# Patient Record
Sex: Male | Born: 1975 | Race: White | Hispanic: No | Marital: Single
Health system: Southern US, Community
[De-identification: ages and names within clinical notes are randomized; demographics above are authoritative.]

---

## 2019-10-12 ENCOUNTER — Other Ambulatory Visit: Payer: Self-pay

## 2019-10-12 ENCOUNTER — Encounter: Payer: Self-pay | Admitting: Emergency Medicine

## 2019-10-12 ENCOUNTER — Emergency Department
Admission: EM | Admit: 2019-10-12 | Discharge: 2019-10-12 | Disposition: A | Discharge status: Home / Self Care | Payer: No Typology Code available for payment source | Attending: Student | Admitting: Student

## 2019-10-12 ENCOUNTER — Emergency Department: Payer: No Typology Code available for payment source

## 2019-10-12 DIAGNOSIS — F17210 Nicotine dependence, cigarettes, uncomplicated: Secondary | ICD-10-CM | POA: Diagnosis not present

## 2019-10-12 DIAGNOSIS — R0602 Shortness of breath: Secondary | ICD-10-CM | POA: Diagnosis present

## 2019-10-12 DIAGNOSIS — R0981 Nasal congestion: Secondary | ICD-10-CM | POA: Diagnosis not present

## 2019-10-12 LAB — COMPREHENSIVE METABOLIC PANEL
ALT: 41 U/L (ref 0–44)
AST: 35 U/L (ref 15–41)
Albumin: 4.2 g/dL (ref 3.5–5.0)
Alkaline Phosphatase: 68 U/L (ref 38–126)
Anion gap: 9 (ref 5–15)
BUN: 16 mg/dL (ref 6–20)
CO2: 25 mmol/L (ref 22–32)
Calcium: 9.2 mg/dL (ref 8.9–10.3)
Chloride: 107 mmol/L (ref 98–111)
Creatinine, Ser: 0.99 mg/dL (ref 0.61–1.24)
GFR calc Af Amer: >60 mL/min (ref >60)
GFR calc non Af Amer: >60 mL/min (ref >60)
Glucose, Bld: 118 mg/dL — ABNORMAL HIGH (ref 70–99)
Potassium: 4.4 mmol/L (ref 3.5–5.1)
Sodium: 141 mmol/L (ref 135–145)
Total Bilirubin: 0.6 mg/dL (ref 0.3–1.2)
Total Protein: 7.6 g/dL (ref 6.5–8.1)

## 2019-10-12 LAB — CBC
HCT: 48.6 % (ref 39.0–52.0)
Hemoglobin: 16.8 g/dL (ref 13.0–17.0)
MCH: 33.5 pg (ref 26.0–34.0)
MCHC: 34.6 g/dL (ref 30.0–36.0)
MCV: 97 fL (ref 80.0–100.0)
Platelets: 210 10*3/uL (ref 150–400)
RBC: 5.01 MIL/uL (ref 4.22–5.81)
RDW: 12.3 % (ref 11.5–15.5)
WBC: 7.3 10*3/uL (ref 4.0–10.5)
nRBC: 0 % (ref 0.0–0.2)

## 2019-10-12 MED ORDER — FEXOFENADINE-PSEUDOEPHED ER 60-120 MG PO TB12: 1.0000 | ORAL_TABLET | Freq: Two times a day (BID) | ORAL | 0 refills | Status: AC

## 2019-10-12 NOTE — ED Triage Notes (Signed)
Pt reports woke up this am and he felt like he could not breathe. Pt states for the last few days he has had a runny nose and he always has a cough because he smokes but he has not felt winded like he does now.

## 2019-10-12 NOTE — Discharge Instructions (Signed)
Follow discharge care instructions take medication as directed. °

## 2019-10-12 NOTE — ED Notes (Signed)
Pt concerned about work crew.  Pt seemed like he would leave it be knew he could get his results from MyChart.  Pt was informed that he should talk to a doctor before making his decision.  LW edt

## 2019-10-12 NOTE — ED Notes (Signed)
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

## 2019-10-12 NOTE — ED Provider Notes (Signed)
[facility] Emergency Department Provider Note   ____________________________________________   First MD Initiated Contact with Patient 10/12/19 0827     (approximate)  I have reviewed the triage vital signs and the nursing notes.   HISTORY  Chief Complaint Shortness of Breath and Nasal Congestion    HPI Jonathan Kline is a 44 y.o. male patient complain of shortness of breath with a.m. awakening.  Patient states he constantly has a runny nose and cough.  Patient also complains of facial pain.  Patient smokes 2 packs of cigarettes a day.  Patient  notice dyspnea increases supine position.  Upon entering the room the patient is laying on his back in no acute distress.  Patient denies chest pain.  Patient denies recent travel or known contact with COVID-19.  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         History reviewed. No pertinent past medical history.  There are no problems to display for this patient.   History reviewed. No pertinent surgical history.  Prior to Admission medications   Medication Sig Start Date End Date Taking? Authorizing Provider  fexofenadine-pseudoephedrine (ALLEGRA-D) 60-120 MG 12 hr tablet Take 1 tablet by mouth 2 (two) times daily. 10/12/19   Sable Feil, PA-C    Allergies Penicillins  No family history on file.  Social History Social History   Tobacco Use  . Smoking status: Not on file  Substance Use Topics  . Alcohol use: Not on file  . Drug use: Not on file    Review of Systems Constitutional: No fever/chills Eyes: No visual changes. ENT: No sore throat.  Nasal congestion Cardiovascular: Denies chest pain. Respiratory:  shortness of breath. Gastrointestinal: No abdominal pain.  No nausea, no vomiting.  No diarrhea.  No constipation. Genitourinary: Negative for dysuria. Musculoskeletal: Negative for back pain. Skin: Negative for rash. Neurological: Negative for  headaches, focal weakness or numbness. Allergic/Immunilogical: Penicillin  ____________________________________________   PHYSICAL EXAM:  VITAL SIGNS: ED Triage Vitals  Enc Vitals Group     BP 10/12/19 0736 130/77     Pulse Rate 10/12/19 0736 94     Resp 10/12/19 0736 16     Temp 10/12/19 0736 97.7 F (36.5 C)     Temp Source 10/12/19 0736 Oral     SpO2 10/12/19 0736 91 %     Weight 10/12/19 0727 180 lb (81.6 kg)     Height 10/12/19 0727 6' (1.829 m)     Head Circumference --      Peak Flow --      Pain Score 10/12/19 0727 0     Pain Loc --      Pain Edu? --      Excl. in [place]? --     Constitutional: Alert and oriented. Well appearing and in no acute distress. Eyes: Conjunctivae are normal. PERRL. EOMI. Head: Atraumatic. Nose: Edematous nasal turbinates clear rhinorrhea.  Bilateral maxillary guarding. Mouth/Throat: Mucous membranes are moist.  Oropharynx non-erythematous. Neck: No stridor.   Hematological/Lymphatic/Immunilogical: No cervical lymphadenopathy. Cardiovascular: Normal rate, regular rhythm. Grossly normal heart sounds.  Good peripheral circulation. Respiratory: Normal respiratory effort.  No retractions. Lungs CTAB. Skin:  Skin is warm, dry and intact. No rash noted. Psychiatric: Mood and affect are normal. Speech and behavior are normal.  ____________________________________________   LABS (all labs ordered are listed, but only abnormal results are displayed)  Labs Reviewed  COMPREHENSIVE METABOLIC PANEL - Abnormal; Notable for the following components:  Result Value   Glucose, Bld 118 (*)    All other components within normal limits  CBC   ____________________________________________  EKG  EKG read by heart station Dr. With no acute findings.  ____________________________________________  RADIOLOGY  ED MD interpretation:   Official radiology report(s): DG Chest 2 View  Result Date: 10/12/2019 CLINICAL DATA:  Shortness of breath EXAM:  CHEST - 2 VIEW COMPARISON:  None. FINDINGS: The heart size and mediastinal contours are within normal limits. Mild elevation of the right hemidiaphragm. There is an apparent osseous defect of the posterolateral right seventh rib. IMPRESSION: 1. Mild elevation of the right hemidiaphragm. No acute abnormality of the lungs. 2. Apparent osseous defect of the posterolateral right seventh rib, of uncertain nature, possibly postoperative versus lytic osseous lesion. Correlate with history of prior thoracotomy, if applicable. Consider CT to further evaluate. Electronically Signed   By: Lauralyn Primes M.D.   On: 10/12/2019 08:17    ____________________________________________   PROCEDURES  Procedure(s) performed (including Critical Care):  Procedures   ____________________________________________   INITIAL IMPRESSION / ASSESSMENT AND PLAN / ED COURSE  As part of my medical decision making, I reviewed the following data within the electronic MEDICAL RECORD NUMBER     Patient presents with pain awakening dysuria and nasal congestion.  Discussed chest x-ray and lab results with patient.  Physical exam is consistent with nasal congestion.  Patient given discharge care instructions and advised take medication as directed.  Advised establish care with [facility].    Jonathan Kline was evaluated in Emergency Department on 10/12/2019 for the symptoms described in the history of present illness. He was evaluated in the context of the global COVID-19 pandemic, which necessitated consideration that the patient might be at risk for infection with the SARS-CoV-2 virus that causes COVID-19. Institutional protocols and algorithms that pertain to the evaluation of patients at risk for COVID-19 are in a state of rapid change based on information released by regulatory bodies including the CDC and federal and state organizations. These policies and algorithms were followed during the patient's care in the ED.        ____________________________________________   FINAL CLINICAL IMPRESSION(S) / ED DIAGNOSES  Final diagnoses:  Nasal congestion     ED Discharge Orders         Ordered    fexofenadine-pseudoephedrine (ALLEGRA-D) 60-120 MG 12 hr tablet  2 times daily     10/12/19 0843           Note:  This document was prepared using Dragon voice recognition software and may include unintentional dictation errors.    Joni Reining, PA-C 10/12/19 4235    Miguel Aschoff., MD 10/12/19 [phone]

## 2021-09-26 IMAGING — CR DG CHEST 2V
1 series · 2 of 2 positions shown · non-contrast
Comparison: None.

CLINICAL DATA: Shortness of breath

EXAM:
CHEST - 2 VIEW

[Series 1: dg chest 2 view · 0.14mm/px · 2 of 2 slices shown]
[im 1/2]
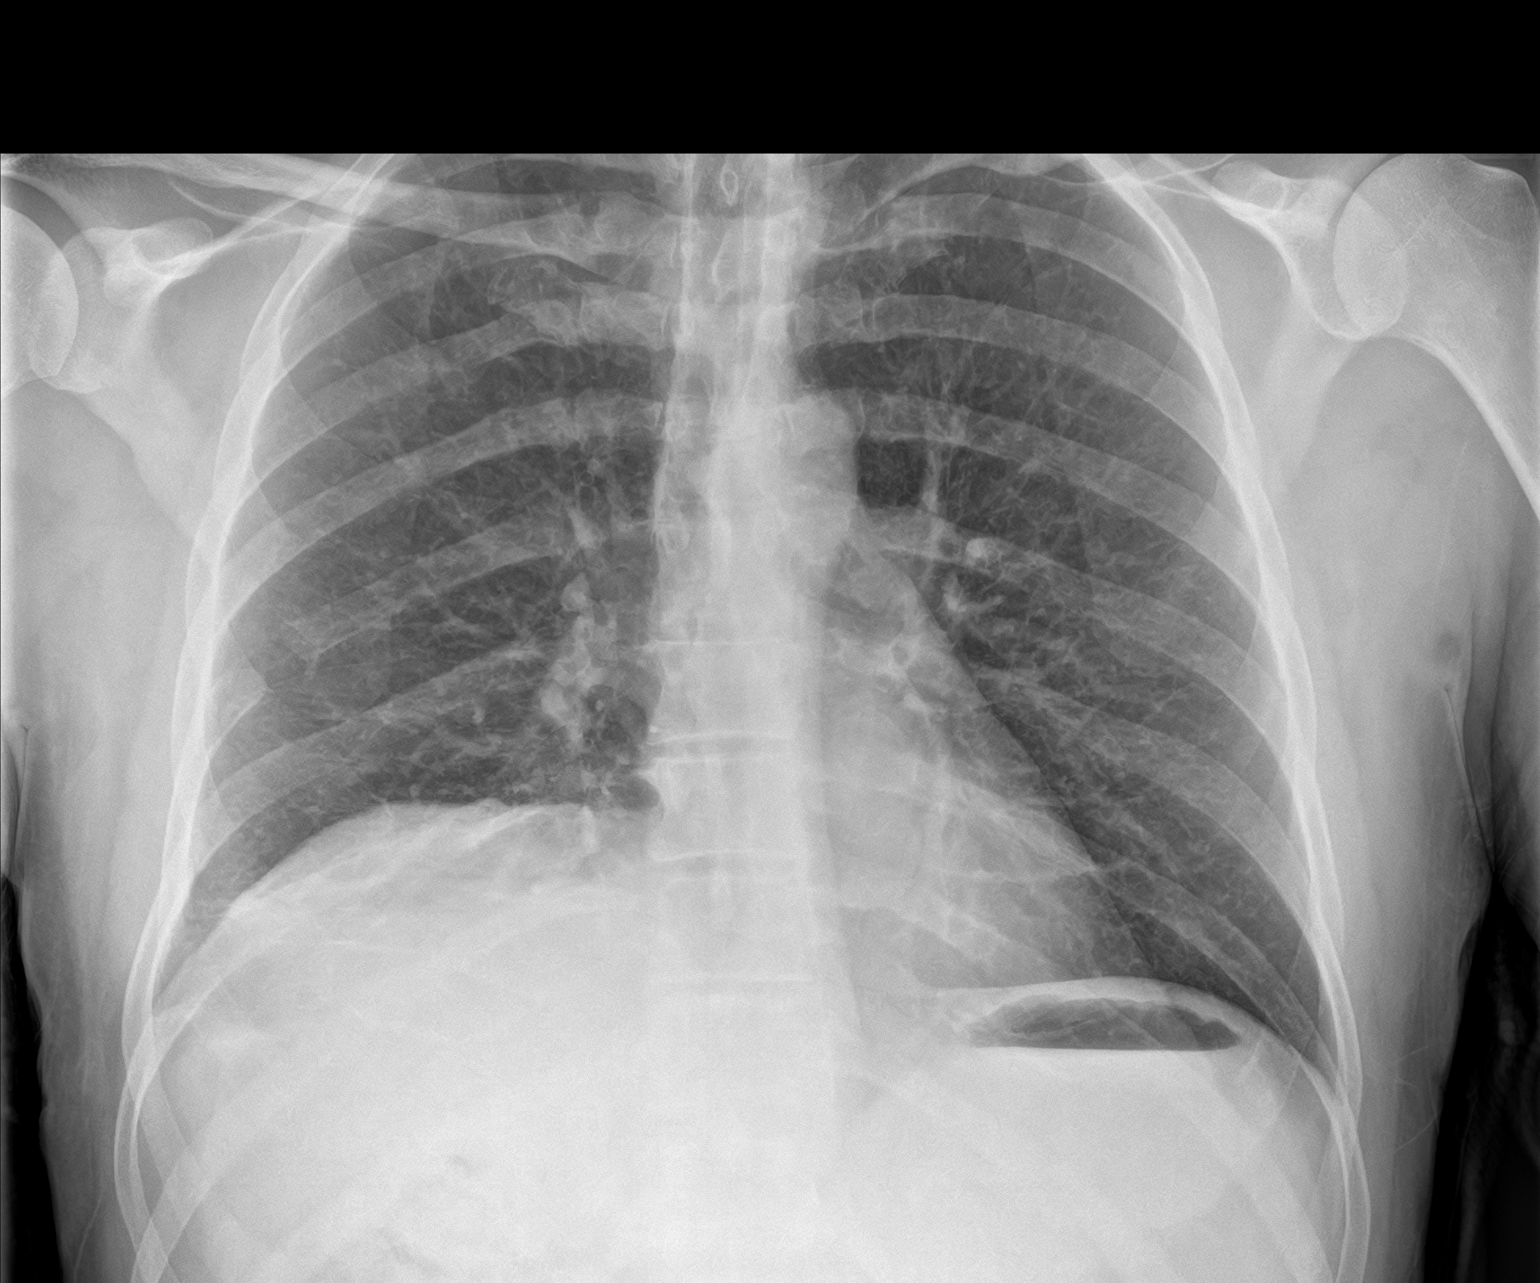
[im 2/2]
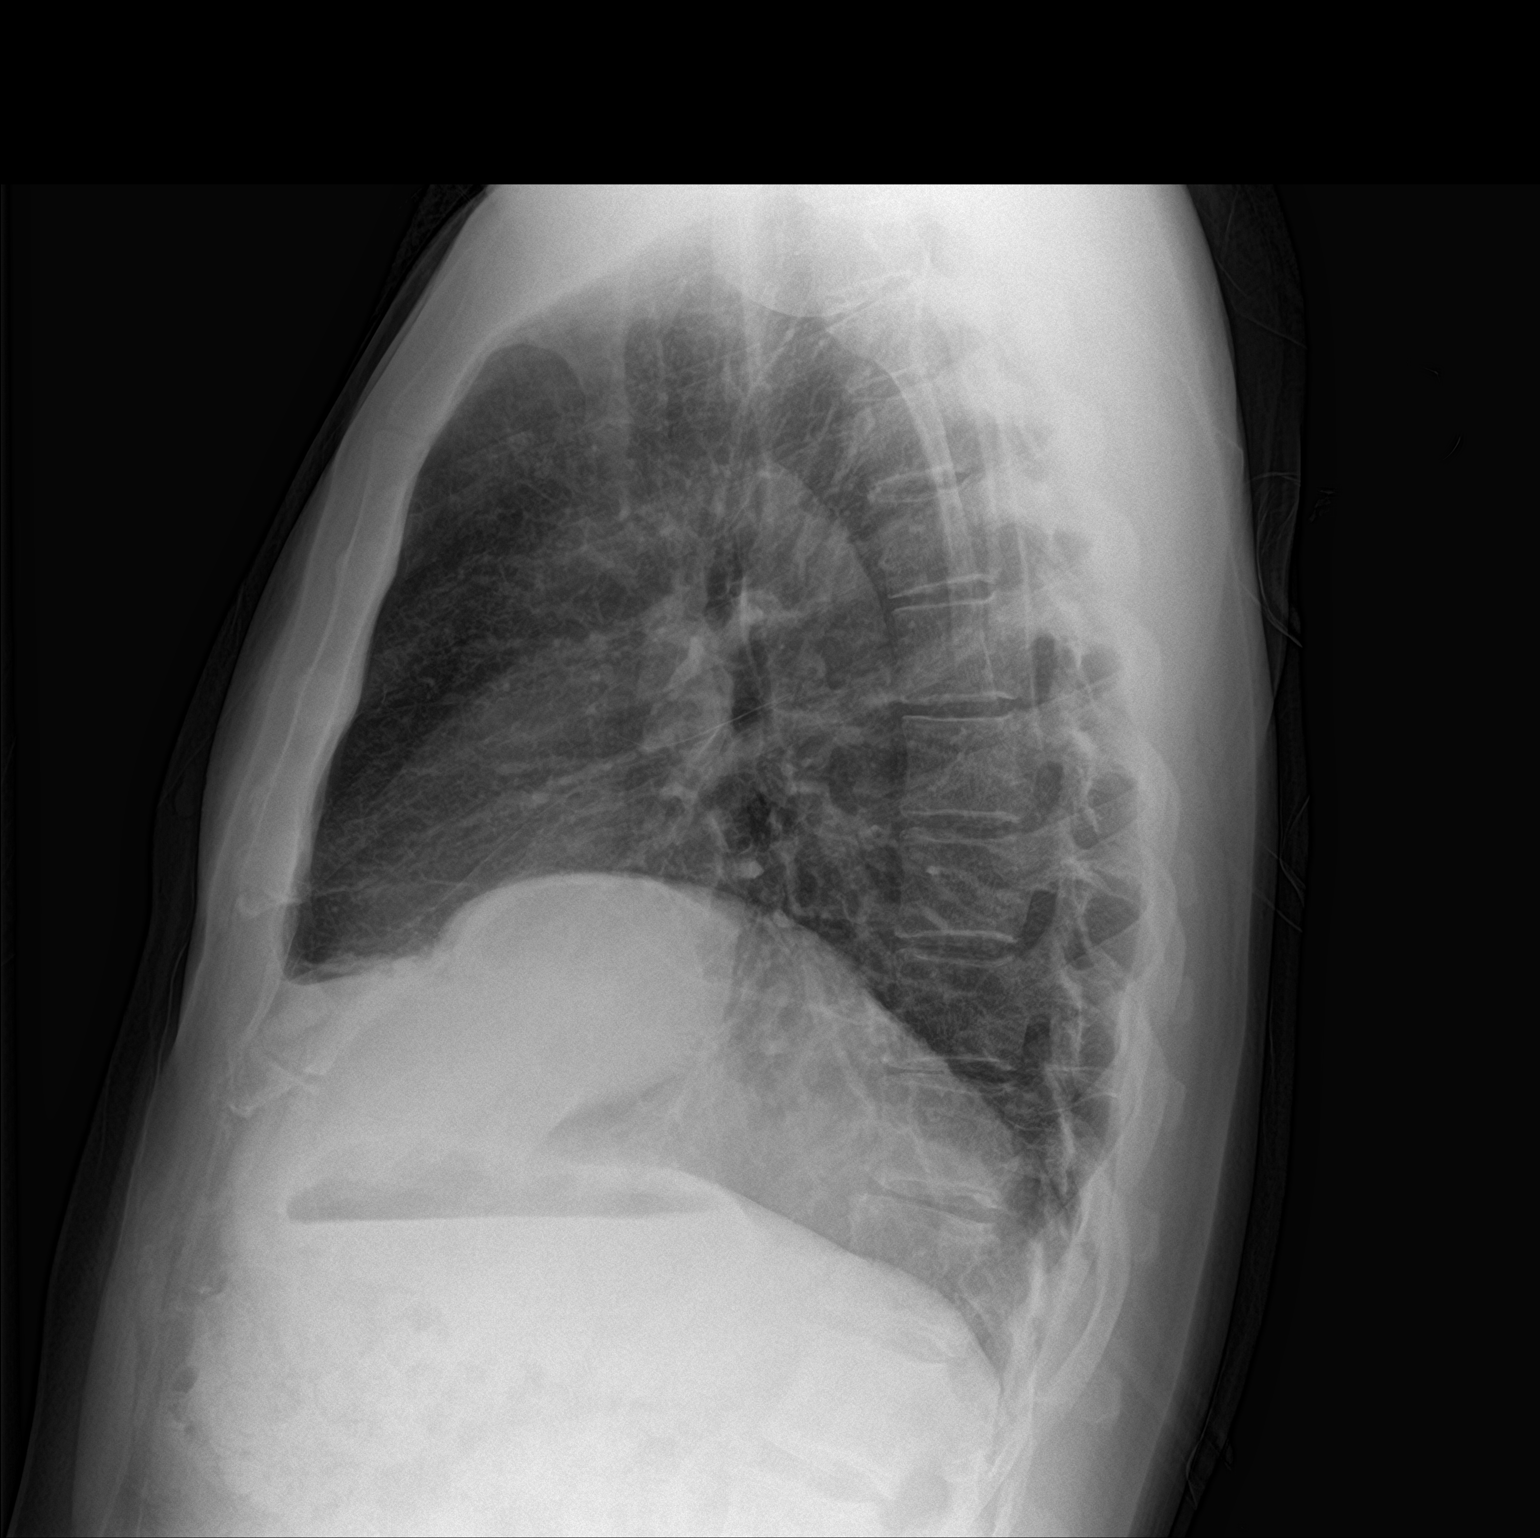

[2 of 2 positions shown; findings below may reference images not displayed]

FINDINGS: The heart size and mediastinal contours are within normal limits.
Mild elevation of the right hemidiaphragm. There is an apparent
osseous defect of the posterolateral right seventh rib.
IMPRESSION: 1. Mild elevation of the right hemidiaphragm. No acute abnormality
of the lungs.
2. Apparent osseous defect of the posterolateral right seventh rib,
of uncertain nature, possibly postoperative versus lytic osseous
lesion. Correlate with history of prior thoracotomy, if applicable.
Consider CT to further evaluate.

## 2023-12-31 NOTE — ED Provider Notes (Signed)
 HPI:  Chief Complaint  Patient presents with  . Shortness of Breath   Patient is a 48 year old male who presents from jail for chest pain and shortness of breath.  Symptoms started several hours ago while he was reading.  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  Describes feeling short of breath and has sharp pain in the center of his chest.  No nausea vomiting or diaphoresis.  Denies any other significant past medical history.             Glasgow Coma Scale Score: 15             History: Slightly suspicious ECG: Normal Age: 53-64 Risk Factors: 1-2 risk factors Troponin: Less than or equal to normal limit HEART Score: 2    Patient History:  History reviewed. No pertinent past medical history.  Past Surgical History:  Procedure Laterality Date  . THORACIC OUTLET SURGERY     displaced organs    Family History  Problem Relation Age of Onset  . Prostate cancer Father's Brother   . Prostate cancer Paternal Grandfather     Social History   Tobacco Use  . Smoking status: Every Day    Current packs/day: 1.00    Types: Cigarettes  . Smokeless tobacco: Not on file  Substance Use Topics  . Alcohol use: Yes    Comment: Drinks beer; Drinks hard liquor   . Drug use: Never      Physical Exam:  ED Triage Vitals [12/31/23 0256]  Temp Heart Rate Resp BP  36.7 C (98.1 F) 93 (!) 38 153/88    SpO2 Temp Source Heart Rate Source Patient Position  97 % Oral -- Sitting    BP Location FiO2 (%)    Right arm --      Physical Exam Vitals and nursing note reviewed.  Constitutional:      General: He is not in acute distress.    Appearance: He is well-developed. He is not ill-appearing.  HENT:     Head: Normocephalic and atraumatic.     Mouth/Throat:     Mouth: Mucous membranes are moist.     Pharynx: Oropharynx is clear.  Eyes:     Extraocular Movements: Extraocular movements intact.     Conjunctiva/sclera: Conjunctivae  normal.  Cardiovascular:     Rate and Rhythm: Regular rhythm. Tachycardia present.     Pulses: Normal pulses.  Pulmonary:     Effort: Pulmonary effort is normal. No respiratory distress.     Breath sounds: Normal breath sounds.  Abdominal:     General: Abdomen is flat. There is no distension.     Palpations: Abdomen is soft.     Tenderness: There is no abdominal tenderness.  Musculoskeletal:        General: No deformity.     Cervical back: Normal range of motion.  Skin:    General: Skin is warm.  Neurological:     General: No focal deficit present.     Mental Status: He is alert and oriented to person, place, and time. Mental status is at baseline.  Psychiatric:        Behavior: Behavior normal.      ED Course & MDM  ED Course as of 12/31/23 0716  Fri Dec 31, 2023  0259 ECG 12 lead Sinus tachycardia with a rate of 102.  Normal axis.  Normal intervals.  Normal R wave progression.  No acute ischemic changes.  No STEMI. [DB]  0404 X-ray Chest 1 View IMPRESSION: Impression: 1. Moderate elevation of the right hemidiaphragm with minimal blunting of the right costophrenic angle, possibly consistent with minimal right pleural fluid or right pleural thickening/scarring. No focal pulmonary consolidation appreciated.    [DB]  0404 Chronic elevation of the right hemidiaphragm on chart review [DB]    ED Course User Index [DB] Toribio GORMAN Lapidus, DO      Clinical Impressions as of 12/31/23 0716  Dyspnea, unspecified type  Chest pain, unspecified type    Vital Signs Temp: 36.7 C (98.1 F) (12/31/2023  2:56 AM) Temp Source: Oral (12/31/2023  2:56 AM) Heart Rate: 90 (12/31/2023  7:00 AM) Resp: 22 (12/31/2023  7:00 AM) Resp Rate Source: Monitor (12/31/2023  2:56 AM) BP: (!) 153/92 (12/31/2023  7:00 AM) BP MAP: 109 (12/31/2023  2:56 AM) Calculated BP MAP: 112 (12/31/2023  7:00 AM) BP Location: Right arm (12/31/2023  2:56 AM) BP Method: Automatic (12/31/2023  2:56 AM) Patient Position:  Sitting (12/31/2023  2:56 AM) Level of Consciousness-NURSES ONLY: Alert (12/31/2023  2:58 AM)    Medical Decision Making Amount and/or Complexity of Data Reviewed Labs: ordered. Radiology: ordered. Decision-making details documented in ED Course. ECG/medicine tests: ordered. Decision-making details documented in ED Course.  Risk Prescription drug management.    Patient presented with chest pain and shortness of breath.  Arrived appearing anxious and tachypneic, appeared to be hyperventilating.  Respiratory alkalosis on VBG consistent with hyperventilation.  Respiratory rate improved without intervention.  On workup ECG is nonischemic.  Troponin and delta troponin both within normal limits.  There is no leukocytosis, fever, or clinical evidence of infection.  No anemia.  Electrolytes renal function LFTs all normal.  Chest x-ray unremarkable.  Very low suspicion for ACS.  Heart score of 2.  Advise outpatient follow-up with his primary care doctor.  Patient discharged home in stable condition.          Toribio GORMAN Lapidus, DO 12/31/23 0251    Toribio GORMAN Lapidus, DO 12/31/23 9283    Toribio GORMAN Lapidus, DO 01/02/24 2244

## 2024-07-16 ENCOUNTER — Other Ambulatory Visit: Payer: Self-pay

## 2024-07-16 ENCOUNTER — Emergency Department (HOSPITAL_COMMUNITY)

## 2024-07-16 ENCOUNTER — Emergency Department (HOSPITAL_COMMUNITY)
Admission: EM | Admit: 2024-07-16 | Discharge: 2024-07-16 | Attending: Emergency Medicine | Admitting: Emergency Medicine

## 2024-07-16 DIAGNOSIS — W1839XA Other fall on same level, initial encounter: Secondary | ICD-10-CM | POA: Insufficient documentation

## 2024-07-16 DIAGNOSIS — S060X9A Concussion with loss of consciousness of unspecified duration, initial encounter: Secondary | ICD-10-CM | POA: Diagnosis not present

## 2024-07-16 DIAGNOSIS — J449 Chronic obstructive pulmonary disease, unspecified: Secondary | ICD-10-CM | POA: Diagnosis not present

## 2024-07-16 DIAGNOSIS — Z23 Encounter for immunization: Secondary | ICD-10-CM | POA: Diagnosis not present

## 2024-07-16 DIAGNOSIS — E876 Hypokalemia: Secondary | ICD-10-CM | POA: Diagnosis not present

## 2024-07-16 DIAGNOSIS — I10 Essential (primary) hypertension: Secondary | ICD-10-CM | POA: Insufficient documentation

## 2024-07-16 DIAGNOSIS — S0990XA Unspecified injury of head, initial encounter: Secondary | ICD-10-CM | POA: Diagnosis present

## 2024-07-16 DIAGNOSIS — S0101XA Laceration without foreign body of scalp, initial encounter: Secondary | ICD-10-CM | POA: Insufficient documentation

## 2024-07-16 LAB — CBC
HCT: 43 % (ref 39.0–52.0)
Hemoglobin: 14.5 g/dL (ref 13.0–17.0)
MCH: 31.9 pg (ref 26.0–34.0)
MCHC: 33.7 g/dL (ref 30.0–36.0)
MCV: 94.5 fL (ref 80.0–100.0)
Platelets: 251 K/uL (ref 150–400)
RBC: 4.55 MIL/uL (ref 4.22–5.81)
RDW: 12.1 % (ref 11.5–15.5)
WBC: 7.4 K/uL (ref 4.0–10.5)
nRBC: 0 % (ref 0.0–0.2)

## 2024-07-16 LAB — COMPREHENSIVE METABOLIC PANEL WITH GFR
ALT: 14 U/L (ref 0–44)
AST: 17 U/L (ref 15–41)
Albumin: 3.5 g/dL (ref 3.5–5.0)
Alkaline Phosphatase: 66 U/L (ref 38–126)
Anion gap: 9 (ref 5–15)
BUN: 15 mg/dL (ref 6–20)
CO2: 28 mmol/L (ref 22–32)
Calcium: 9 mg/dL (ref 8.9–10.3)
Chloride: 101 mmol/L (ref 98–111)
Creatinine, Ser: 1.01 mg/dL (ref 0.61–1.24)
GFR, Estimated: >60 mL/min (ref >60)
Glucose, Bld: 110 mg/dL — ABNORMAL HIGH (ref 70–99)
Potassium: 3.3 mmol/L — ABNORMAL LOW (ref 3.5–5.1)
Sodium: 138 mmol/L (ref 135–145)
Total Bilirubin: 0.5 mg/dL (ref 0.0–1.2)
Total Protein: 6.6 g/dL (ref 6.5–8.1)

## 2024-07-16 LAB — CBG MONITORING, ED: Glucose-Capillary: 97 mg/dL (ref 70–99)

## 2024-07-16 MED ORDER — FENTANYL CITRATE (PF) 50 MCG/ML IJ SOSY
50.0000 ug | PREFILLED_SYRINGE | Freq: Once | INTRAMUSCULAR | Status: AC
  Administered 2024-07-16: 50 ug via INTRAVENOUS
  Filled 2024-07-16: qty 1

## 2024-07-16 MED ORDER — OXYCODONE HCL 5 MG PO TABS
5.0000 mg | ORAL_TABLET | Freq: Once | ORAL | Status: AC
  Administered 2024-07-16: 5 mg via ORAL
  Filled 2024-07-16: qty 1

## 2024-07-16 MED ORDER — ACETAMINOPHEN 500 MG PO TABS
1000.0000 mg | ORAL_TABLET | Freq: Once | ORAL | Status: AC
  Administered 2024-07-16: 1000 mg via ORAL
  Filled 2024-07-16: qty 2

## 2024-07-16 MED ORDER — TETANUS-DIPHTH-ACELL PERTUSSIS 5-2-15.5 LF-MCG/0.5 IM SUSP
0.5000 mL | Freq: Once | INTRAMUSCULAR | Status: AC
  Administered 2024-07-16: 0.5 mL via INTRAMUSCULAR
  Filled 2024-07-16: qty 0.5

## 2024-07-16 NOTE — ED Provider Notes (Signed)
 [facility] AT [facility] Provider Note  CSN: [id number] Arrival date & time: 07/16/24 2024  Chief Complaint(s) Loss of Consciousness  HPI [name] is a 48 y.o. male history of hypertension, hyperlipidemia, COPD presenting after fall.  Patient is an [place] the [place].  He sleeps on the upper bunk.  Was found by prison staff on the ground.  Patient does not remember falling off his bunk.  He apparently had a period where he had decreased responsiveness but patient is not sure.  Patient reports headache, pain around area of swelling to the head.  Denies any neck or back pain.  Denies any chest or abdominal pain.  Denies any pain to the extremities.  Denies any blood thinner use.   Past Medical History No past medical history on file. There are no active problems to display for this patient.  Home Medication(s) Prior to Admission medications   Medication Sig Start Date End Date Taking? Authorizing Provider  fexofenadine -pseudoephedrine (ALLEGRA-D) 60-120 MG 12 hr tablet Take 1 tablet by mouth 2 (two) times daily. 10/12/19   Claudene Tanda POUR, PA-C                                                                                                                                    Past Surgical History No past surgical history on file. Family History No family history on file.  Social History   Allergies Penicillins  Review of Systems Review of Systems  All other systems reviewed and are negative.   Physical Exam Vital Signs  I have reviewed the triage vital signs BP (!) 155/97   Pulse 61   Temp 97.8 F (36.6 C)   Resp 17   Ht 6' (1.829 m)   Wt 79.4 kg   SpO2 93%   BMI 23.73 kg/m  Physical Exam Vitals and nursing note reviewed.  Constitutional:      General: He is not in acute distress.    Appearance: Normal appearance.  HENT:     Head: Normocephalic.     Comments: Large hematoma to the left forehead, less than 0.5 cm wound     Mouth/Throat:     Mouth: Mucous membranes are moist.  Eyes:     Conjunctiva/sclera: Conjunctivae normal.  Cardiovascular:     Rate and Rhythm: Normal rate and regular rhythm.  Pulmonary:     Effort: Pulmonary effort is normal. No respiratory distress.     Breath sounds: Normal breath sounds.  Abdominal:     General: Abdomen is flat.     Palpations: Abdomen is soft.     Tenderness: There is no abdominal tenderness.  Musculoskeletal:     Right lower leg: No edema.     Left lower leg: No edema.     Comments: No midline C, T, L-spine tenderness.  Flexion/extension/lateral rotation of the neck all intact.  No tenderness or deformity, limitation  in range of motion in the extremities.  No chest wall tenderness or crepitus.  Pelvis stable  Skin:    General: Skin is warm and dry.     Capillary Refill: Capillary refill takes less than 2 seconds.  Neurological:     Mental Status: He is alert and oriented to person, place, and time. Mental status is at baseline.  Psychiatric:        Mood and Affect: Mood normal.        Behavior: Behavior normal.     ED Results and Treatments Labs (all labs ordered are listed, but only abnormal results are displayed) Labs Reviewed  COMPREHENSIVE METABOLIC PANEL WITH GFR - Abnormal; Notable for the following components:      Result Value   Potassium 3.3 (*)    Glucose, Bld 110 (*)    All other components within normal limits  CBC  URINALYSIS, ROUTINE W REFLEX MICROSCOPIC  CBG MONITORING, ED                                                                                                                          Radiology CT Maxillofacial Wo Contrast Result Date: 07/16/2024 EXAM: CT OF THE FACE WITHOUT CONTRAST 07/16/2024 09:10:00 PM TECHNIQUE: CT of the face was performed without the administration of intravenous contrast. Multiplanar reformatted images are provided for review. Automated exposure control, iterative reconstruction, and/or weight based  adjustment of the mA/kV was utilized to reduce the radiation dose to as low as reasonably achievable. COMPARISON: None available. CLINICAL HISTORY: Facial trauma, blunt. FINDINGS: FACIAL BONES: No acute facial fracture. No mandibular dislocation. No suspicious bone lesion. ORBITS: Bilateral globes and retroorbital soft tissues are within normal limits. SINUSES AND MASTOIDS: No acute abnormality. SOFT TISSUES: Large extracranial hematoma overlying the left frontal bone and lateral orbit. IMPRESSION: 1. No acute facial fracture. 2. Large extracranial hematoma over the left frontal bone and lateral orbit. Electronically signed by: Pinkie Pebbles MD 07/16/2024 09:33 PM EST RP Workstation: [id number]   CT HEAD WO CONTRAST Result Date: 07/16/2024 EXAM: CT HEAD WITHOUT CONTRAST 07/16/2024 09:04:00 PM TECHNIQUE: CT of the head was performed without the administration of intravenous contrast. Automated exposure control, iterative reconstruction, and/or weight based adjustment of the mA/kV was utilized to reduce the radiation dose to as low as reasonably achievable. COMPARISON: None available. CLINICAL HISTORY: Head trauma, moderate-severe. FINDINGS: BRAIN AND VENTRICLES: No acute hemorrhage. No evidence of acute infarct. No hydrocephalus. No extra-axial collection. No mass effect or midline shift. ORBITS: Underlying left globe and retroconal soft tissues are within normal limits. SINUSES: No acute abnormality. SOFT TISSUES AND SKULL: Large extracranial hematoma overlying the left lateral frontal bone and lateral orbit (image 22). No skull fracture. IMPRESSION: 1. No acute intracranial abnormality. 2. Large extracranial hematoma overlying the left lateral frontal bone and lateral orbit. Electronically signed by: Pinkie Pebbles MD 07/16/2024 09:30 PM EST RP Workstation: [id number]    Pertinent labs & imaging results that were available during my care  of the patient were reviewed by me and considered in my medical  decision making (see MDM for details).  Medications Ordered in ED Medications  oxyCODONE (Oxy IR/ROXICODONE) immediate release tablet 5 mg (has no administration in time range)  Tdap (ADACEL) injection 0.5 mL (has no administration in time range)  fentaNYL (SUBLIMAZE) injection 50 mcg (50 mcg Intravenous Given 07/16/24 2057)  acetaminophen (TYLENOL) tablet 1,000 mg (1,000 mg Oral Given 07/16/24 2210)                                                                                                                                     Procedures .Laceration Repair  Date/Time: 07/16/2024 10:51 PM  Performed by: Francesca Elsie CROME, MD Authorized by: Francesca Elsie CROME, MD   Consent:    Consent obtained:  Verbal   Consent given by:  Patient   Risks, benefits, and alternatives were discussed: yes     Risks discussed:  Infection, need for additional repair, nerve damage, pain, vascular damage, poor wound healing, poor cosmetic result, retained foreign body and tendon damage   Alternatives discussed:  No treatment Universal protocol:    Patient identity confirmed:  Verbally with patient and arm band Laceration details:    Location:  Scalp   Scalp location:  L temporal   Length (cm):  1 Exploration:    Hemostasis achieved with:  Direct pressure   Wound exploration: wound explored through full range of motion and entire depth of wound visualized     Wound extent: areolar tissue not violated, fascia not violated, no foreign body, no signs of injury, no nerve damage, no tendon damage, no underlying fracture and no vascular damage   Treatment:    Area cleansed with:  Saline   Amount of cleaning:  Standard   Irrigation solution:  Sterile saline   Debridement:  None   Undermining:  None   Scar revision: no   Skin repair:    Repair method:  Tissue adhesive Approximation:    Approximation:  Close Repair type:    Repair type:  Simple Post-procedure details:    Procedure completion:   Tolerated well, no immediate complications   (including critical care time)  Medical Decision Making / ED Course   MDM:  48 year old presenting to the emergency department head injury.  Seems most likely patient had a fall from bunk.  Patient does have large hematoma to the forehead.  Patient did have a period of loss of consciousness after injury.  Now seems to have returned to baseline.  Will obtain CT head.  Will check CT face as well given significant swelling.  Will reassess.  Clinical Course as of 07/16/24 2254  [place] Jul 16, 2024  2252 CT head, CT face negative.  Patient well-appearing, just complaining of headache.  Workup is overall reassuring.  Suspect likely patient had loss of consciousness from head injury rather than syncope leading to head injury.  Seems patient was asleep and fell off bunk per prison staff.  Do not think that the patient needs further inpatient workup or management.  Suspect concussion.  Recommended supportive care.  Feel patient is stable for discharge.  Discussed return precautions with patient and prison guard, also listed on D/c instructions. Will discharge patient to home. All questions answered. Patient comfortable with plan of discharge. Return precautions discussed with patient and specified on the after visit summary.  [WS]    Clinical Course User Index [WS] Francesca Elsie CROME, MD     Additional history obtained: -Additional history obtained from ems -External records from outside source obtained and reviewed including: Chart review including previous notes, labs, imaging, consultation notes including prior notes    Lab Tests: -I ordered, reviewed, and interpreted labs.   The pertinent results include:   Labs Reviewed  COMPREHENSIVE METABOLIC PANEL WITH GFR - Abnormal; Notable for the following components:      Result Value   Potassium 3.3 (*)    Glucose, Bld 110 (*)    All other components within normal limits  CBC  URINALYSIS, ROUTINE  W REFLEX MICROSCOPIC  CBG MONITORING, ED    Notable for mild hypokalemia   EKG   EKG Interpretation Date/Time:  Sunday July 16 2024 20:28:23 EST Ventricular Rate:  63 PR Interval:  173 QRS Duration:  91 QT Interval:  402 QTC Calculation: 412 R Axis:   86  Text Interpretation: Sinus rhythm Confirmed by Francesca Elsie ([id number]) on 07/16/2024 9:20:43 PM         Imaging Studies ordered: I ordered imaging studies including CT head, CT face  On my interpretation imaging demonstrates no acute process I independently visualized and interpreted imaging. I agree with the radiologist interpretation   Medicines ordered and prescription drug management: Meds ordered this encounter  Medications   fentaNYL (SUBLIMAZE) injection 50 mcg   acetaminophen (TYLENOL) tablet 1,000 mg   oxyCODONE (Oxy IR/ROXICODONE) immediate release tablet 5 mg    Refill:  0   Tdap (ADACEL) injection 0.5 mL    -I have reviewed the patients home medicines and have made adjustments as needed   Social Determinants of Health:  Diagnosis or treatment significantly limited by social determinants of health: inmate    Reevaluation: After the interventions noted above, I reevaluated the patient and found that their symptoms have improved  Co morbidities that complicate the patient evaluation No past medical history on file.    Dispostion: Disposition decision including need for hospitalization was considered, and patient discharged from emergency department.    Final Clinical Impression(s) / ED Diagnoses Final diagnoses:  Concussion with loss of consciousness, with loc of unspecified duration, initial encounter     This chart was dictated using voice recognition software.  Despite best efforts to proofread,  errors can occur which can change the documentation meaning.    Francesca Elsie CROME, MD 07/16/24 [phone]

## 2024-07-16 NOTE — Discharge Instructions (Addendum)
 We evaluated you for your head injury.  Your testing in the emergency department was reassuring.  We did not see any bleeding in your brain or severe head injury.  We repaired your skin laceration with skin glue.  This will fall off on its own.  Please take Tylenol (acetaminophen) and Motrin (ibuprofen) for your symptoms at home.  You can take 1000 mg of Tylenol every 6 hours and 600 mg of Motrin every 6 hours as needed for your symptoms.  You can take these medicines together as needed, either at the same time, or alternating every 3 hours.  If you develop any new or worsening such as severe worsening pain, vomiting, confusion or weakness, please return to the emergency department for recheck.

## 2024-07-16 NOTE — ED Notes (Signed)
 Patient transported to CT

## 2024-07-16 NOTE — ED Triage Notes (Addendum)
 Pt BIB [facility] coming from [facility] [place] with guard on arrival. Patient brought in for syncopal episode. Per EMS pt also with N/V; headache and dizziness. Visible hematoma above L eye. GCS on EMS arrival 11. GCS on ED arrival 15.  EMS: CBG 81 80HR 94%RA  180/90 4mg  Zofran 100mcg Fentanyl
